# Patient Record
Sex: Male | Born: 1995 | Race: Black or African American | Hispanic: No | Marital: Single | Smoking: Current every day smoker
Health system: Southern US, Community
[De-identification: ages and names within clinical notes are randomized; demographics above are authoritative.]

## PROBLEM LIST (undated history)

## (undated) DIAGNOSIS — L0291 Cutaneous abscess, unspecified: Secondary | ICD-10-CM

---
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
## 2016-07-27 ENCOUNTER — Emergency Department (HOSPITAL_COMMUNITY)
Admission: EM | Admit: 2016-07-27 | Discharge: 2016-07-27 | Disposition: A | Discharge status: Home / Self Care | Payer: Self-pay | Attending: Emergency Medicine | Admitting: Emergency Medicine

## 2016-07-27 ENCOUNTER — Encounter (HOSPITAL_COMMUNITY): Payer: Self-pay | Admitting: Emergency Medicine

## 2016-07-27 DIAGNOSIS — L02416 Cutaneous abscess of left lower limb: Secondary | ICD-10-CM | POA: Insufficient documentation

## 2016-07-27 DIAGNOSIS — Y929 Unspecified place or not applicable: Secondary | ICD-10-CM | POA: Insufficient documentation

## 2016-07-27 DIAGNOSIS — Y939 Activity, unspecified: Secondary | ICD-10-CM | POA: Insufficient documentation

## 2016-07-27 DIAGNOSIS — F17200 Nicotine dependence, unspecified, uncomplicated: Secondary | ICD-10-CM | POA: Insufficient documentation

## 2016-07-27 DIAGNOSIS — Y999 Unspecified external cause status: Secondary | ICD-10-CM | POA: Insufficient documentation

## 2016-07-27 DIAGNOSIS — W57XXXA Bitten or stung by nonvenomous insect and other nonvenomous arthropods, initial encounter: Secondary | ICD-10-CM | POA: Insufficient documentation

## 2016-07-27 MED ORDER — SULFAMETHOXAZOLE-TRIMETHOPRIM 800-160 MG PO TABS: 1.0000 | ORAL_TABLET | Freq: Two times a day (BID) | ORAL | 0 refills | Status: AC

## 2016-07-27 MED ORDER — NAPROXEN 500 MG PO TABS: 500.0000 mg | ORAL_TABLET | Freq: Two times a day (BID) | ORAL | 0 refills | Status: DC

## 2016-07-27 NOTE — ED Notes (Signed)
See np assessment 

## 2016-07-27 NOTE — ED Triage Notes (Signed)
Pt presents to ED for assessment of a bite ntoed to his left knee, with swelling, warmth, pain and swollen lymphnodes in the groin.  Pt has 99.7 temperature.  Pt sts he noted the "bite" 2 days ago.

## 2016-07-27 NOTE — ED Provider Notes (Signed)
[facility]EMERGENCY DEPT Provider Note   CSN: [id number] Arrival date & time: 07/27/16  1804  By signing my name below, I, Christel Mormon, attest that this documentation has been prepared under the direction and in the presence of Kerrie Buffalo, NP. Electronically Signed: Christel Mormon, Scribe. 07/27/2016. 7:04 PM.    History   Chief Complaint Chief Complaint  Patient presents with  . Abscess    The history is provided by the patient. No language interpreter was used.   HPI Comments:  [name] is a 20 y.o. male who presents to the Emergency Department complaining of a bite to his L knee that occurred 2 days ago. Pt reports swollen lymph nodes in the groin. Pt reports that he may have been bitten by something. Pt denies fever, chills, nausea, vomiting.   History reviewed. No pertinent past medical history.  There are no active problems to display for this patient.   History reviewed. No pertinent surgical history.     Home Medications    Prior to Admission medications   Medication Sig Start Date End Date Taking? Authorizing Provider  naproxen (NAPROSYN) 500 MG tablet Take 1 tablet (500 mg total) by mouth 2 (two) times daily. 07/27/16   Katrianna Friesenhahn Orlene Och, NP  sulfamethoxazole-trimethoprim (BACTRIM DS,SEPTRA DS) 800-160 MG tablet Take 1 tablet by mouth 2 (two) times daily. 07/27/16 08/03/16  Nidhi Jacome Orlene Och, NP    Family History History reviewed. No pertinent family history.  Social History Social History  Substance Use Topics  . Smoking status: Current Every Day Smoker    Packs/day: 0.50  . Smokeless tobacco: Never Used  . Alcohol use No     Allergies   Review of patient's allergies indicates not on file.   Review of Systems Review of Systems  Constitutional: Negative for chills.  Skin: Positive for wound.  Hematological: Positive for adenopathy.  All other systems reviewed and are negative.    Physical Exam Updated Vital Signs BP 130/74 (BP Location: Left  Arm)   Pulse 71   Temp 99.3 F (37.4 C) (Oral)   Resp 19   Ht 6\' 1"  (1.854 m)   Wt 67.3 kg   SpO2 97%   BMI 19.58 kg/m   Physical Exam  Constitutional: He is oriented to person, place, and time. He appears well-developed and well-nourished. No distress.  HENT:  Head: Normocephalic and atraumatic.  Eyes: Conjunctivae are normal.  Cardiovascular: Normal rate and intact distal pulses.   Pulmonary/Chest: Effort normal.  Abdominal: He exhibits no distension.  Musculoskeletal: Normal range of motion. He exhibits no edema.  Neurological: He is alert and oriented to person, place, and time.  Skin: Skin is warm and dry. No erythema.  Pea-sized lymph nodes palpable in L inguinal area.   Area on medial aspect of L knee that is scabbed and tender on palpation. Pain radiates to inner aspect of L thigh. No cellulitis or red streaking.  Psychiatric: He has a normal mood and affect.  Nursing note and vitals reviewed.    ED Treatments / Results  DIAGNOSTIC STUDIES:  Oxygen Saturation is 96% on RA, normal by my interpretation.    COORDINATION OF CARE:  7:04 PM Discussed treatment plan with pt at bedside and pt agreed to plan.   Labs (all labs ordered are listed, but only abnormal results are displayed) Labs Reviewed - No data to display  Radiology No results found.  Procedures Procedures (including critical care time)  Medications Ordered in ED Medications - No data  to display   Initial Impression / Assessment and Plan / ED Course  I have reviewed the triage vital signs and the nursing notes.  Clinical Course    Final Clinical Impressions(s) / ED Diagnoses  20 y.o. male with insect bite to the left knee and left inguinal lymph node enlargement stable for d/c without fever and does not appear toxic. Discussed with the patient clinical findings and plan of care. All questioned fully answered. He will return if any problems arise.  Final diagnoses:  Infected insect bite     New Prescriptions Discharge Medication List as of 07/27/2016  7:08 PM    START taking these medications   Details  naproxen (NAPROSYN) 500 MG tablet Take 1 tablet (500 mg total) by mouth 2 (two) times daily., Starting Wed 07/27/2016, Print    sulfamethoxazole-trimethoprim (BACTRIM DS,SEPTRA DS) 800-160 MG tablet Take 1 tablet by mouth 2 (two) times daily., Starting Wed 07/27/2016, Until Wed 08/03/2016, Print      I personally performed the services described in this documentation, which was scribed in my presence. The recorded information has been reviewed and is accurate.     [address]Kayona Foor CarmineM Michalla Ringer, NP 07/29/16 0127    Maia PlanJoshua G Long, MD 07/29/16 1009

## 2016-07-27 NOTE — Discharge Instructions (Signed)
Return for worsening symptoms such as fever, red streaking or n/v.  Apply warm wet compresses to the area 3 times a day.

## 2016-09-28 ENCOUNTER — Encounter (HOSPITAL_COMMUNITY): Payer: Self-pay | Admitting: Emergency Medicine

## 2016-09-28 ENCOUNTER — Ambulatory Visit (HOSPITAL_COMMUNITY)
Admission: EM | Admit: 2016-09-28 | Discharge: 2016-09-28 | Disposition: A | Discharge status: Home / Self Care | Payer: Self-pay | Attending: Family Medicine | Admitting: Family Medicine

## 2016-09-28 DIAGNOSIS — Z202 Contact with and (suspected) exposure to infections with a predominantly sexual mode of transmission: Secondary | ICD-10-CM | POA: Insufficient documentation

## 2016-09-28 DIAGNOSIS — F17210 Nicotine dependence, cigarettes, uncomplicated: Secondary | ICD-10-CM | POA: Insufficient documentation

## 2016-09-28 MED ORDER — METRONIDAZOLE 500 MG PO TABS: 2000.0000 mg | ORAL_TABLET | Freq: Once | ORAL | 0 refills | Status: AC

## 2016-09-28 NOTE — ED Provider Notes (Signed)
CSN: [id number]     Arrival date & time 09/28/16  1400 History   First MD Initiated Contact with Patient 09/28/16 1533     Chief Complaint  Patient presents with  . Exposure to STD   (Consider location/radiation/quality/duration/timing/severity/associated sxs/prior Treatment) Patient is a healthy 20 y.o male is here for STD testing. Girlfriend is present in room. Patient was brought in by the girlfriend. Girlfriend got tested positive for Tr[facility] 2 days ago [facility] but negative for GC/CT. Girlfriend reports that patient is her only sexual partner. Patient himself is asymptomatic without any penile rash, discharge, pain, dysuria, testicular pain or testicular swelling. Girlfriend asked to stepped out the room for privacy and patient reports to have only 1 sexual partner as well. Patient would like to be treated for trich presumptively.       History reviewed. No pertinent past medical history. History reviewed. No pertinent surgical history. No family history on file. Social History  Substance Use Topics  . Smoking status: Current Every Day Smoker    Packs/day: 0.50  . Smokeless tobacco: Never Used  . Alcohol use No    Review of Systems  Constitutional:       As stated in the HPI    Allergies  Patient has no known allergies.  Home Medications   Prior to Admission medications   Medication Sig Start Date End Date Taking? Authorizing Provider  metroNIDAZOLE (FLAGYL) 500 MG tablet Take 4 tablets (2,000 mg total) by mouth once. 09/28/16 09/28/16  Lucia EstelleFeng Sie Formisano, NP  naproxen (NAPROSYN) 500 MG tablet Take 1 tablet (500 mg total) by mouth 2 (two) times daily. 07/27/16   Hope Orlene OchM Neese, NP   Meds Ordered and Administered this Visit  Medications - No data to display  BP 143/55 (BP Location: Left Arm)   Pulse (!) 58   Temp 98.2 F (36.8 C) (Oral)   Resp 16   SpO2 100%  No data found.   Physical Exam  Constitutional: He is oriented to person, place, and time. He  appears well-developed and well-nourished.  Cardiovascular: Normal rate, regular rhythm and normal heart sounds.   Pulmonary/Chest: Effort normal and breath sounds normal. No respiratory distress.  Genitourinary:  Genitourinary Comments: Declines examination  Neurological: He is alert and oriented to person, place, and time.  Nursing note and vitals reviewed.   Urgent Care Course   Clinical Course     Procedures (including critical care time)  Labs Review Labs Reviewed  URINE CYTOLOGY ANCILLARY ONLY    Imaging Review No results found.  MDM   1. STD exposure    1) Cytology pending for GC/CT and Trich 2) Rx for flagyl 2000 mg once given 3) Informed that we only call patient if result is positive. 4) Avoid sexual intercourse until treated.  5) Notify any other sexual partner (if any) if result(s) are positive.    Lucia EstelleFeng Calysta Craigo, NP 09/28/16 [phone]

## 2016-09-28 NOTE — Discharge Instructions (Signed)
Call back in 3-5 days to find out about the result.

## 2016-09-29 LAB — URINE CYTOLOGY ANCILLARY ONLY
Chlamydia: NEGATIVE
NEISSERIA GONORRHEA: NEGATIVE
Trichomonas: NEGATIVE

## 2020-06-17 ENCOUNTER — Encounter (HOSPITAL_COMMUNITY): Payer: Self-pay | Admitting: Emergency Medicine

## 2020-06-17 ENCOUNTER — Other Ambulatory Visit: Payer: Self-pay

## 2020-06-17 ENCOUNTER — Emergency Department (HOSPITAL_COMMUNITY)
Admission: EM | Admit: 2020-06-17 | Discharge: 2020-06-17 | Disposition: A | Discharge status: Home / Self Care | Payer: Self-pay | Attending: Emergency Medicine | Admitting: Emergency Medicine

## 2020-06-17 DIAGNOSIS — Y998 Other external cause status: Secondary | ICD-10-CM | POA: Insufficient documentation

## 2020-06-17 DIAGNOSIS — Y929 Unspecified place or not applicable: Secondary | ICD-10-CM | POA: Insufficient documentation

## 2020-06-17 DIAGNOSIS — S1190XA Unspecified open wound of unspecified part of neck, initial encounter: Secondary | ICD-10-CM | POA: Insufficient documentation

## 2020-06-17 DIAGNOSIS — W3400XA Accidental discharge from unspecified firearms or gun, initial encounter: Secondary | ICD-10-CM | POA: Insufficient documentation

## 2020-06-17 DIAGNOSIS — Z5321 Procedure and treatment not carried out due to patient leaving prior to being seen by health care provider: Secondary | ICD-10-CM | POA: Insufficient documentation

## 2020-06-17 DIAGNOSIS — Y939 Activity, unspecified: Secondary | ICD-10-CM | POA: Insufficient documentation

## 2020-06-17 NOTE — ED Notes (Signed)
Patient called for room placement x3 without answer. 
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
## 2020-07-03 ENCOUNTER — Emergency Department (HOSPITAL_COMMUNITY)
Admission: EM | Admit: 2020-07-03 | Discharge: 2020-07-03 | Discharge status: Against Medical Advice | Payer: Medicaid Other

## 2020-07-03 NOTE — ED Notes (Signed)
Pt left prior to triage

## 2020-11-13 ENCOUNTER — Encounter (HOSPITAL_COMMUNITY): Payer: Self-pay

## 2020-11-13 ENCOUNTER — Emergency Department (HOSPITAL_COMMUNITY)
Admission: EM | Admit: 2020-11-13 | Discharge: 2020-11-13 | Disposition: A | Discharge status: Home / Self Care | Payer: Medicaid Other | Attending: Emergency Medicine | Admitting: Emergency Medicine

## 2020-11-13 ENCOUNTER — Other Ambulatory Visit: Payer: Self-pay

## 2020-11-13 DIAGNOSIS — R4182 Altered mental status, unspecified: Secondary | ICD-10-CM | POA: Insufficient documentation

## 2020-11-13 DIAGNOSIS — Y907 Blood alcohol level of 200-239 mg/100 ml: Secondary | ICD-10-CM | POA: Insufficient documentation

## 2020-11-13 DIAGNOSIS — Z5321 Procedure and treatment not carried out due to patient leaving prior to being seen by health care provider: Secondary | ICD-10-CM | POA: Insufficient documentation

## 2020-11-13 DIAGNOSIS — R42 Dizziness and giddiness: Secondary | ICD-10-CM | POA: Insufficient documentation

## 2020-11-13 DIAGNOSIS — F10129 Alcohol abuse with intoxication, unspecified: Secondary | ICD-10-CM | POA: Insufficient documentation

## 2020-11-13 LAB — COMPREHENSIVE METABOLIC PANEL
ALT: 22 U/L (ref 0–44)
AST: 25 U/L (ref 15–41)
Albumin: 3.9 g/dL (ref 3.5–5.0)
Alkaline Phosphatase: 53 U/L (ref 38–126)
Anion gap: 12 (ref 5–15)
BUN: 9 mg/dL (ref 6–20)
CO2: 25 mmol/L (ref 22–32)
Calcium: 9.3 mg/dL (ref 8.9–10.3)
Chloride: 105 mmol/L (ref 98–111)
Creatinine, Ser: 1.11 mg/dL (ref 0.61–1.24)
GFR, Estimated: >60 mL/min (ref >60)
Glucose, Bld: 93 mg/dL (ref 70–99)
Potassium: 3.8 mmol/L (ref 3.5–5.1)
Sodium: 142 mmol/L (ref 135–145)
Total Bilirubin: 0.7 mg/dL (ref 0.3–1.2)
Total Protein: 7 g/dL (ref 6.5–8.1)

## 2020-11-13 LAB — SALICYLATE LEVEL: Salicylate Lvl: <7 mg/dL — ABNORMAL LOW (ref 7.0–30.0)

## 2020-11-13 LAB — RAPID URINE DRUG SCREEN, HOSP PERFORMED
Amphetamines: NOT DETECTED
Barbiturates: NOT DETECTED
Benzodiazepines: NOT DETECTED
Cocaine: NOT DETECTED
Opiates: NOT DETECTED
Tetrahydrocannabinol: NOT DETECTED

## 2020-11-13 LAB — ACETAMINOPHEN LEVEL: Acetaminophen (Tylenol), Serum: <10 ug/mL — ABNORMAL LOW (ref 10–30)

## 2020-11-13 LAB — CBC
HCT: 50.7 % (ref 39.0–52.0)
Hemoglobin: 16 g/dL (ref 13.0–17.0)
MCH: 28.3 pg (ref 26.0–34.0)
MCHC: 31.6 g/dL (ref 30.0–36.0)
MCV: 89.6 fL (ref 80.0–100.0)
Platelets: 245 10*3/uL (ref 150–400)
RBC: 5.66 MIL/uL (ref 4.22–5.81)
RDW: 13.1 % (ref 11.5–15.5)
WBC: 4.6 10*3/uL (ref 4.0–10.5)
nRBC: 0 % (ref 0.0–0.2)

## 2020-11-13 LAB — ETHANOL: Alcohol, Ethyl (B): 209 mg/dL — ABNORMAL HIGH (ref <10)
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
## 2020-11-27 ENCOUNTER — Emergency Department (HOSPITAL_COMMUNITY): Payer: Medicaid Other

## 2020-11-27 ENCOUNTER — Emergency Department (HOSPITAL_COMMUNITY)
Admission: EM | Admit: 2020-11-27 | Discharge: 2020-11-27 | Disposition: A | Discharge status: Home / Self Care | Payer: Medicaid Other | Attending: Emergency Medicine | Admitting: Emergency Medicine

## 2020-11-27 ENCOUNTER — Encounter (HOSPITAL_COMMUNITY): Payer: Self-pay

## 2020-11-27 DIAGNOSIS — T1490XA Injury, unspecified, initial encounter: Secondary | ICD-10-CM

## 2020-11-27 DIAGNOSIS — Y92524 Gas station as the place of occurrence of the external cause: Secondary | ICD-10-CM | POA: Insufficient documentation

## 2020-11-27 DIAGNOSIS — W3400XA Accidental discharge from unspecified firearms or gun, initial encounter: Secondary | ICD-10-CM

## 2020-11-27 DIAGNOSIS — Y249XXA Unspecified firearm discharge, undetermined intent, initial encounter: Secondary | ICD-10-CM | POA: Insufficient documentation

## 2020-11-27 DIAGNOSIS — Z20822 Contact with and (suspected) exposure to covid-19: Secondary | ICD-10-CM | POA: Insufficient documentation

## 2020-11-27 DIAGNOSIS — S71101A Unspecified open wound, right thigh, initial encounter: Secondary | ICD-10-CM | POA: Insufficient documentation

## 2020-11-27 DIAGNOSIS — Z23 Encounter for immunization: Secondary | ICD-10-CM | POA: Insufficient documentation

## 2020-11-27 LAB — PROTIME-INR
INR: 0.9 (ref 0.8–1.2)
Prothrombin Time: 12.2 seconds (ref 11.4–15.2)

## 2020-11-27 LAB — I-STAT CHEM 8, ED
BUN: 14 mg/dL (ref 6–20)
Calcium, Ion: 1.07 mmol/L — ABNORMAL LOW (ref 1.15–1.40)
Chloride: 105 mmol/L (ref 98–111)
Creatinine, Ser: 1.3 mg/dL — ABNORMAL HIGH (ref 0.61–1.24)
Glucose, Bld: 94 mg/dL (ref 70–99)
HCT: 50 % (ref 39.0–52.0)
Hemoglobin: 17 g/dL (ref 13.0–17.0)
Potassium: 3.4 mmol/L — ABNORMAL LOW (ref 3.5–5.1)
Sodium: 141 mmol/L (ref 135–145)
TCO2: 23 mmol/L (ref 22–32)

## 2020-11-27 LAB — ETHANOL: Alcohol, Ethyl (B): 183 mg/dL — ABNORMAL HIGH (ref <10)

## 2020-11-27 LAB — COMPREHENSIVE METABOLIC PANEL
ALT: 22 U/L (ref 0–44)
AST: 26 U/L (ref 15–41)
Albumin: 4 g/dL (ref 3.5–5.0)
Alkaline Phosphatase: 61 U/L (ref 38–126)
Anion gap: 14 (ref 5–15)
BUN: 13 mg/dL (ref 6–20)
CO2: 20 mmol/L — ABNORMAL LOW (ref 22–32)
Calcium: 9.1 mg/dL (ref 8.9–10.3)
Chloride: 105 mmol/L (ref 98–111)
Creatinine, Ser: 1.17 mg/dL (ref 0.61–1.24)
GFR, Estimated: >60 mL/min (ref >60)
Glucose, Bld: 102 mg/dL — ABNORMAL HIGH (ref 70–99)
Potassium: 3.4 mmol/L — ABNORMAL LOW (ref 3.5–5.1)
Sodium: 139 mmol/L (ref 135–145)
Total Bilirubin: 0.7 mg/dL (ref 0.3–1.2)
Total Protein: 7.1 g/dL (ref 6.5–8.1)

## 2020-11-27 LAB — CBC
HCT: 50.7 % (ref 39.0–52.0)
Hemoglobin: 16 g/dL (ref 13.0–17.0)
MCH: 28.6 pg (ref 26.0–34.0)
MCHC: 31.6 g/dL (ref 30.0–36.0)
MCV: 90.5 fL (ref 80.0–100.0)
Platelets: 206 10*3/uL (ref 150–400)
RBC: 5.6 MIL/uL (ref 4.22–5.81)
RDW: 13.3 % (ref 11.5–15.5)
WBC: 7.8 10*3/uL (ref 4.0–10.5)
nRBC: 0 % (ref 0.0–0.2)

## 2020-11-27 LAB — SARS CORONAVIRUS 2 BY RT PCR (HOSPITAL ORDER, PERFORMED IN ~~LOC~~ HOSPITAL LAB): SARS Coronavirus 2: NEGATIVE

## 2020-11-27 LAB — SAMPLE TO BLOOD BANK

## 2020-11-27 LAB — LACTIC ACID, PLASMA: Lactic Acid, Venous: 2.8 mmol/L (ref 0.5–1.9)

## 2020-11-27 MED ORDER — TETANUS-DIPHTH-ACELL PERTUSSIS 5-2.5-18.5 LF-MCG/0.5 IM SUSY
0.5000 mL | PREFILLED_SYRINGE | Freq: Once | INTRAMUSCULAR | Status: AC
  Administered 2020-11-27: 0.5 mL via INTRAMUSCULAR

## 2020-11-27 MED ORDER — IOHEXOL 350 MG/ML SOLN
100.0000 mL | Freq: Once | INTRAVENOUS | Status: AC | PRN
  Administered 2020-11-27: 100 mL via INTRAVENOUS

## 2020-11-27 MED ORDER — FENTANYL CITRATE (PF) 100 MCG/2ML IJ SOLN
50.0000 ug | Freq: Once | INTRAMUSCULAR | Status: AC
  Administered 2020-11-27: 50 ug via INTRAVENOUS

## 2020-11-27 MED ORDER — HYDROCODONE-ACETAMINOPHEN 5-325 MG PO TABS: 2.0000 | ORAL_TABLET | Freq: Four times a day (QID) | ORAL | 0 refills | Status: DC | PRN

## 2020-11-27 MED ORDER — FENTANYL CITRATE (PF) 100 MCG/2ML IJ SOLN
INTRAMUSCULAR | Status: AC
  Filled 2020-11-27: qty 2

## 2020-11-27 NOTE — ED Notes (Signed)
Mother at bedside.
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
## 2020-11-30 ENCOUNTER — Encounter (HOSPITAL_COMMUNITY): Payer: Self-pay

## 2020-12-08 ENCOUNTER — Encounter (HOSPITAL_COMMUNITY): Payer: Self-pay

## 2020-12-08 ENCOUNTER — Inpatient Hospital Stay (HOSPITAL_COMMUNITY)
Admission: EM | Admit: 2020-12-08 | Discharge: 2020-12-11 | DRG: 581 | Disposition: A | Discharge status: Home / Self Care | Payer: Self-pay | Attending: Orthopedic Surgery | Admitting: Orthopedic Surgery

## 2020-12-08 ENCOUNTER — Other Ambulatory Visit: Payer: Self-pay

## 2020-12-08 ENCOUNTER — Emergency Department (HOSPITAL_COMMUNITY): Payer: Medicaid Other

## 2020-12-08 DIAGNOSIS — L02419 Cutaneous abscess of limb, unspecified: Secondary | ICD-10-CM | POA: Diagnosis present

## 2020-12-08 DIAGNOSIS — L02415 Cutaneous abscess of right lower limb: Principal | ICD-10-CM | POA: Diagnosis present

## 2020-12-08 DIAGNOSIS — S71131A Puncture wound without foreign body, right thigh, initial encounter: Secondary | ICD-10-CM

## 2020-12-08 DIAGNOSIS — S71141D Puncture wound with foreign body, right thigh, subsequent encounter: Secondary | ICD-10-CM

## 2020-12-08 DIAGNOSIS — R509 Fever, unspecified: Secondary | ICD-10-CM

## 2020-12-08 DIAGNOSIS — Z20822 Contact with and (suspected) exposure to covid-19: Secondary | ICD-10-CM | POA: Diagnosis present

## 2020-12-08 DIAGNOSIS — F17210 Nicotine dependence, cigarettes, uncomplicated: Secondary | ICD-10-CM | POA: Diagnosis present

## 2020-12-08 DIAGNOSIS — A419 Sepsis, unspecified organism: Secondary | ICD-10-CM

## 2020-12-08 DIAGNOSIS — L0291 Cutaneous abscess, unspecified: Secondary | ICD-10-CM

## 2020-12-08 DIAGNOSIS — W3409XD Accidental discharge from other specified firearms, subsequent encounter: Secondary | ICD-10-CM

## 2020-12-08 DIAGNOSIS — L03115 Cellulitis of right lower limb: Secondary | ICD-10-CM | POA: Diagnosis present

## 2020-12-08 HISTORY — DX: Cutaneous abscess, unspecified: L02.91

## 2020-12-08 MED ORDER — VANCOMYCIN HCL 1500 MG/300ML IV SOLN
1500.0000 mg | Freq: Once | INTRAVENOUS | Status: AC
  Administered 2020-12-09: 1500 mg via INTRAVENOUS
  Filled 2020-12-08: qty 300

## 2020-12-08 MED ORDER — LACTATED RINGERS IV SOLN: INTRAVENOUS | Status: AC

## 2020-12-08 MED ORDER — SODIUM CHLORIDE 0.9 % IV SOLN
2.0000 g | Freq: Once | INTRAVENOUS | Status: AC
  Administered 2020-12-08: 2 g via INTRAVENOUS
  Filled 2020-12-08: qty 20

## 2020-12-08 MED ORDER — LACTATED RINGERS IV BOLUS (SEPSIS)
1000.0000 mL | Freq: Once | INTRAVENOUS | Status: AC
  Administered 2020-12-09: 1000 mL via INTRAVENOUS

## 2020-12-08 MED ORDER — MORPHINE SULFATE (PF) 4 MG/ML IV SOLN
4.0000 mg | Freq: Once | INTRAVENOUS | Status: DC
Start: 2020-12-08 — End: 2020-12-12
  Filled 2020-12-08: qty 1

## 2020-12-08 MED ORDER — ACETAMINOPHEN 325 MG PO TABS
650.0000 mg | ORAL_TABLET | Freq: Once | ORAL | Status: AC
  Administered 2020-12-09: 650 mg via ORAL
  Filled 2020-12-08: qty 2

## 2020-12-08 MED ORDER — LACTATED RINGERS IV BOLUS (SEPSIS)
1000.0000 mL | Freq: Once | INTRAVENOUS | Status: AC
Start: 2020-12-08 — End: 2020-12-09
  Administered 2020-12-09: 1000 mL via INTRAVENOUS
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
## 2020-12-09 ENCOUNTER — Encounter (HOSPITAL_COMMUNITY): Payer: Self-pay | Admitting: Internal Medicine

## 2020-12-09 ENCOUNTER — Inpatient Hospital Stay (HOSPITAL_COMMUNITY): Payer: Medicaid Other | Admitting: Certified Registered Nurse Anesthetist

## 2020-12-09 ENCOUNTER — Encounter (HOSPITAL_COMMUNITY)
Admission: EM | Disposition: A | Discharge status: Home / Self Care | Payer: Self-pay | Source: Home / Self Care | Attending: Orthopedic Surgery

## 2020-12-09 ENCOUNTER — Emergency Department (HOSPITAL_COMMUNITY): Payer: Medicaid Other

## 2020-12-09 DIAGNOSIS — A419 Sepsis, unspecified organism: Secondary | ICD-10-CM

## 2020-12-09 DIAGNOSIS — L02419 Cutaneous abscess of limb, unspecified: Secondary | ICD-10-CM | POA: Diagnosis present

## 2020-12-09 DIAGNOSIS — L02415 Cutaneous abscess of right lower limb: Secondary | ICD-10-CM | POA: Diagnosis present

## 2020-12-09 DIAGNOSIS — S71131A Puncture wound without foreign body, right thigh, initial encounter: Secondary | ICD-10-CM

## 2020-12-09 DIAGNOSIS — L03115 Cellulitis of right lower limb: Secondary | ICD-10-CM

## 2020-12-09 DIAGNOSIS — R509 Fever, unspecified: Secondary | ICD-10-CM

## 2020-12-09 HISTORY — PX: OTHER SURGICAL HISTORY: SHX169

## 2020-12-09 HISTORY — PX: I & D EXTREMITY: SHX5045

## 2020-12-09 LAB — CBC WITH DIFFERENTIAL/PLATELET
Abs Immature Granulocytes: 0.1 10*3/uL — ABNORMAL HIGH (ref 0.00–0.07)
Basophils Absolute: 0 10*3/uL (ref 0.0–0.1)
Basophils Relative: 0 %
Eosinophils Absolute: 0.1 10*3/uL (ref 0.0–0.5)
Eosinophils Relative: 0 %
HCT: 43.7 % (ref 39.0–52.0)
Hemoglobin: 14 g/dL (ref 13.0–17.0)
Immature Granulocytes: 1 %
Lymphocytes Relative: 15 %
Lymphs Abs: 2.4 10*3/uL (ref 0.7–4.0)
MCH: 28.3 pg (ref 26.0–34.0)
MCHC: 32 g/dL (ref 30.0–36.0)
MCV: 88.3 fL (ref 80.0–100.0)
Monocytes Absolute: 0.6 10*3/uL (ref 0.1–1.0)
Monocytes Relative: 4 %
Neutro Abs: 12.2 10*3/uL — ABNORMAL HIGH (ref 1.7–7.7)
Neutrophils Relative %: 80 %
Platelets: 450 10*3/uL — ABNORMAL HIGH (ref 150–400)
RBC: 4.95 MIL/uL (ref 4.22–5.81)
RDW: 12.8 % (ref 11.5–15.5)
WBC: 15.4 10*3/uL — ABNORMAL HIGH (ref 4.0–10.5)
nRBC: 0 % (ref 0.0–0.2)

## 2020-12-09 LAB — PROTIME-INR
INR: 1.1 (ref 0.8–1.2)
Prothrombin Time: 13.8 seconds (ref 11.4–15.2)

## 2020-12-09 LAB — BASIC METABOLIC PANEL
Anion gap: 11 (ref 5–15)
BUN: 10 mg/dL (ref 6–20)
CO2: 23 mmol/L (ref 22–32)
Calcium: 8.3 mg/dL — ABNORMAL LOW (ref 8.9–10.3)
Chloride: 103 mmol/L (ref 98–111)
Creatinine, Ser: 0.88 mg/dL (ref 0.61–1.24)
GFR, Estimated: >60 mL/min (ref >60)
Glucose, Bld: 92 mg/dL (ref 70–99)
Potassium: 3.9 mmol/L (ref 3.5–5.1)
Sodium: 137 mmol/L (ref 135–145)

## 2020-12-09 LAB — URINALYSIS, ROUTINE W REFLEX MICROSCOPIC
Bilirubin Urine: NEGATIVE
Glucose, UA: NEGATIVE mg/dL
Hgb urine dipstick: NEGATIVE
Ketones, ur: NEGATIVE mg/dL
Leukocytes,Ua: NEGATIVE
Nitrite: NEGATIVE
Protein, ur: NEGATIVE mg/dL
Specific Gravity, Urine: 1.034 — ABNORMAL HIGH (ref 1.005–1.030)
pH: 6 (ref 5.0–8.0)

## 2020-12-09 LAB — CBC
HCT: 39.3 % (ref 39.0–52.0)
Hemoglobin: 13 g/dL (ref 13.0–17.0)
MCH: 29.1 pg (ref 26.0–34.0)
MCHC: 33.1 g/dL (ref 30.0–36.0)
MCV: 88.1 fL (ref 80.0–100.0)
Platelets: 389 10*3/uL (ref 150–400)
RBC: 4.46 MIL/uL (ref 4.22–5.81)
RDW: 12.9 % (ref 11.5–15.5)
WBC: 11 10*3/uL — ABNORMAL HIGH (ref 4.0–10.5)
nRBC: 0 % (ref 0.0–0.2)

## 2020-12-09 LAB — COMPREHENSIVE METABOLIC PANEL
ALT: 39 U/L (ref 0–44)
AST: 27 U/L (ref 15–41)
Albumin: 2.8 g/dL — ABNORMAL LOW (ref 3.5–5.0)
Alkaline Phosphatase: 59 U/L (ref 38–126)
Anion gap: 11 (ref 5–15)
BUN: 10 mg/dL (ref 6–20)
CO2: 25 mmol/L (ref 22–32)
Calcium: 8.9 mg/dL (ref 8.9–10.3)
Chloride: 100 mmol/L (ref 98–111)
Creatinine, Ser: 1.07 mg/dL (ref 0.61–1.24)
GFR, Estimated: >60 mL/min (ref >60)
Glucose, Bld: 100 mg/dL — ABNORMAL HIGH (ref 70–99)
Potassium: 3.7 mmol/L (ref 3.5–5.1)
Sodium: 136 mmol/L (ref 135–145)
Total Bilirubin: 0.7 mg/dL (ref 0.3–1.2)
Total Protein: 7.2 g/dL (ref 6.5–8.1)

## 2020-12-09 LAB — APTT: aPTT: 28 seconds (ref 24–36)

## 2020-12-09 LAB — RESP PANEL BY RT-PCR (FLU A&B, COVID) ARPGX2
Influenza A by PCR: NEGATIVE
Influenza B by PCR: NEGATIVE
SARS Coronavirus 2 by RT PCR: NEGATIVE

## 2020-12-09 LAB — LACTIC ACID, PLASMA
Lactic Acid, Venous: 1 mmol/L (ref 0.5–1.9)
Lactic Acid, Venous: 1.4 mmol/L (ref 0.5–1.9)

## 2020-12-09 LAB — HIV ANTIBODY (ROUTINE TESTING W REFLEX): HIV Screen 4th Generation wRfx: NONREACTIVE

## 2020-12-09 SURGERY — IRRIGATION AND DEBRIDEMENT EXTREMITY
Anesthesia: General | Site: Leg Upper | Laterality: Right

## 2020-12-09 MED ORDER — SODIUM CHLORIDE 0.9 % IV SOLN: INTRAVENOUS | Status: DC

## 2020-12-09 MED ORDER — PROPOFOL 10 MG/ML IV BOLUS
INTRAVENOUS | Status: DC | PRN
Start: 2020-12-09 — End: 2020-12-09
  Administered 2020-12-09: 200 mg via INTRAVENOUS

## 2020-12-09 MED ORDER — VANCOMYCIN HCL 1500 MG/300ML IV SOLN
1500.0000 mg | Freq: Two times a day (BID) | INTRAVENOUS | Status: DC
  Administered 2020-12-09 – 2020-12-11 (×5): 1500 mg via INTRAVENOUS
  Filled 2020-12-09 (×7): qty 300

## 2020-12-09 MED ORDER — SODIUM CHLORIDE 0.9 % IV SOLN
2.0000 g | INTRAVENOUS | Status: DC
  Administered 2020-12-09 – 2020-12-11 (×3): 2 g via INTRAVENOUS
  Filled 2020-12-09 (×2): qty 20

## 2020-12-09 MED ORDER — METOCLOPRAMIDE HCL 5 MG/ML IJ SOLN: 5.0000 mg | Freq: Three times a day (TID) | INTRAMUSCULAR | Status: DC | PRN

## 2020-12-09 MED ORDER — ACETAMINOPHEN 650 MG RE SUPP: 650.0000 mg | Freq: Four times a day (QID) | RECTAL | Status: DC | PRN

## 2020-12-09 MED ORDER — PROMETHAZINE HCL 25 MG/ML IJ SOLN: 6.2500 mg | INTRAMUSCULAR | Status: DC | PRN

## 2020-12-09 MED ORDER — OXYCODONE HCL 5 MG/5ML PO SOLN: 5.0000 mg | Freq: Once | ORAL | Status: AC | PRN

## 2020-12-09 MED ORDER — 0.9 % SODIUM CHLORIDE (POUR BTL) OPTIME
TOPICAL | Status: DC | PRN
  Administered 2020-12-09: 1000 mL

## 2020-12-09 MED ORDER — FENTANYL CITRATE (PF) 250 MCG/5ML IJ SOLN
INTRAMUSCULAR | Status: AC
  Filled 2020-12-09: qty 5

## 2020-12-09 MED ORDER — CHLORHEXIDINE GLUCONATE 0.12 % MT SOLN
OROMUCOSAL | Status: AC
  Filled 2020-12-09: qty 15

## 2020-12-09 MED ORDER — ROCURONIUM BROMIDE 10 MG/ML (PF) SYRINGE
PREFILLED_SYRINGE | INTRAVENOUS | Status: AC
  Filled 2020-12-09: qty 10

## 2020-12-09 MED ORDER — CHLORHEXIDINE GLUCONATE 4 % EX LIQD
60.0000 mL | Freq: Once | CUTANEOUS | Status: DC
  Filled 2020-12-09: qty 60

## 2020-12-09 MED ORDER — FENTANYL CITRATE (PF) 100 MCG/2ML IJ SOLN
INTRAMUSCULAR | Status: AC
  Filled 2020-12-09: qty 2

## 2020-12-09 MED ORDER — ONDANSETRON HCL 4 MG/2ML IJ SOLN: 4.0000 mg | Freq: Four times a day (QID) | INTRAMUSCULAR | Status: DC | PRN

## 2020-12-09 MED ORDER — OXYCODONE HCL 5 MG PO TABS
ORAL_TABLET | ORAL | Status: AC
  Filled 2020-12-09: qty 1

## 2020-12-09 MED ORDER — ONDANSETRON HCL 4 MG/2ML IJ SOLN
INTRAMUSCULAR | Status: AC
  Filled 2020-12-09: qty 2

## 2020-12-09 MED ORDER — SODIUM CHLORIDE 0.9 % IR SOLN
Status: DC | PRN
  Administered 2020-12-09: 3000 mL

## 2020-12-09 MED ORDER — DEXAMETHASONE SODIUM PHOSPHATE 10 MG/ML IJ SOLN
INTRAMUSCULAR | Status: AC
  Filled 2020-12-09: qty 1

## 2020-12-09 MED ORDER — SODIUM CHLORIDE 0.9 % IV SOLN
2.0000 g | INTRAVENOUS | Status: DC
  Filled 2020-12-09: qty 20

## 2020-12-09 MED ORDER — HYDROMORPHONE HCL 1 MG/ML IJ SOLN: 0.5000 mg | INTRAMUSCULAR | Status: DC | PRN

## 2020-12-09 MED ORDER — ONDANSETRON HCL 4 MG PO TABS: 4.0000 mg | ORAL_TABLET | Freq: Four times a day (QID) | ORAL | Status: DC | PRN

## 2020-12-09 MED ORDER — ROCURONIUM BROMIDE 10 MG/ML (PF) SYRINGE
PREFILLED_SYRINGE | INTRAVENOUS | Status: DC | PRN
  Administered 2020-12-09: 20 mg via INTRAVENOUS

## 2020-12-09 MED ORDER — PHENYLEPHRINE 40 MCG/ML (10ML) SYRINGE FOR IV PUSH (FOR BLOOD PRESSURE SUPPORT)
PREFILLED_SYRINGE | INTRAVENOUS | Status: DC | PRN
  Administered 2020-12-09 (×4): 80 ug via INTRAVENOUS

## 2020-12-09 MED ORDER — OXYCODONE HCL 5 MG PO TABS: 5.0000 mg | ORAL_TABLET | ORAL | Status: DC | PRN

## 2020-12-09 MED ORDER — ONDANSETRON HCL 4 MG/2ML IJ SOLN
INTRAMUSCULAR | Status: DC | PRN
  Administered 2020-12-09: 4 mg via INTRAVENOUS

## 2020-12-09 MED ORDER — SUCCINYLCHOLINE CHLORIDE 200 MG/10ML IV SOSY
PREFILLED_SYRINGE | INTRAVENOUS | Status: AC
  Filled 2020-12-09: qty 10

## 2020-12-09 MED ORDER — DOCUSATE SODIUM 100 MG PO CAPS
100.0000 mg | ORAL_CAPSULE | Freq: Two times a day (BID) | ORAL | Status: DC
  Administered 2020-12-09 – 2020-12-11 (×4): 100 mg via ORAL
  Filled 2020-12-09 (×4): qty 1

## 2020-12-09 MED ORDER — FENTANYL CITRATE (PF) 250 MCG/5ML IJ SOLN
INTRAMUSCULAR | Status: DC | PRN
  Administered 2020-12-09 (×5): 50 ug via INTRAVENOUS

## 2020-12-09 MED ORDER — LACTATED RINGERS IV SOLN: INTRAVENOUS | Status: DC | PRN

## 2020-12-09 MED ORDER — PHENYLEPHRINE 40 MCG/ML (10ML) SYRINGE FOR IV PUSH (FOR BLOOD PRESSURE SUPPORT)
PREFILLED_SYRINGE | INTRAVENOUS | Status: AC
  Filled 2020-12-09: qty 10

## 2020-12-09 MED ORDER — ACETAMINOPHEN 325 MG PO TABS: 325.0000 mg | ORAL_TABLET | Freq: Four times a day (QID) | ORAL | Status: DC | PRN

## 2020-12-09 MED ORDER — FENTANYL CITRATE (PF) 100 MCG/2ML IJ SOLN
25.0000 ug | INTRAMUSCULAR | Status: DC | PRN
  Administered 2020-12-09 (×3): 50 ug via INTRAVENOUS

## 2020-12-09 MED ORDER — OXYCODONE HCL 5 MG PO TABS
5.0000 mg | ORAL_TABLET | Freq: Once | ORAL | Status: AC | PRN
  Administered 2020-12-09: 5 mg via ORAL

## 2020-12-09 MED ORDER — LIDOCAINE 2% (20 MG/ML) 5 ML SYRINGE
INTRAMUSCULAR | Status: AC
  Filled 2020-12-09: qty 5

## 2020-12-09 MED ORDER — MIDAZOLAM HCL 2 MG/2ML IJ SOLN
INTRAMUSCULAR | Status: DC | PRN
  Administered 2020-12-09: 2 mg via INTRAVENOUS

## 2020-12-09 MED ORDER — KETOROLAC TROMETHAMINE 15 MG/ML IJ SOLN
7.5000 mg | Freq: Four times a day (QID) | INTRAMUSCULAR | Status: AC
  Administered 2020-12-09 – 2020-12-10 (×4): 7.5 mg via INTRAVENOUS
  Filled 2020-12-09 (×4): qty 1

## 2020-12-09 MED ORDER — POVIDONE-IODINE 10 % EX SWAB
2.0000 "application " | Freq: Once | CUTANEOUS | Status: AC
  Administered 2020-12-09: 2 via TOPICAL

## 2020-12-09 MED ORDER — METRONIDAZOLE IN NACL 5-0.79 MG/ML-% IV SOLN
500.0000 mg | Freq: Three times a day (TID) | INTRAVENOUS | Status: DC
  Administered 2020-12-09 – 2020-12-11 (×7): 500 mg via INTRAVENOUS
  Filled 2020-12-09 (×7): qty 100

## 2020-12-09 MED ORDER — ASPIRIN EC 325 MG PO TBEC
325.0000 mg | DELAYED_RELEASE_TABLET | Freq: Every day | ORAL | Status: DC
  Administered 2020-12-09 – 2020-12-11 (×3): 325 mg via ORAL
  Filled 2020-12-09 (×3): qty 1

## 2020-12-09 MED ORDER — MIDAZOLAM HCL 2 MG/2ML IJ SOLN
INTRAMUSCULAR | Status: AC
  Filled 2020-12-09: qty 2

## 2020-12-09 MED ORDER — MORPHINE SULFATE (PF) 2 MG/ML IV SOLN
2.0000 mg | INTRAVENOUS | Status: DC | PRN
  Filled 2020-12-09: qty 1

## 2020-12-09 MED ORDER — SUCCINYLCHOLINE CHLORIDE 200 MG/10ML IV SOSY
PREFILLED_SYRINGE | INTRAVENOUS | Status: DC | PRN
  Administered 2020-12-09: 120 mg via INTRAVENOUS

## 2020-12-09 MED ORDER — PROPOFOL 10 MG/ML IV BOLUS
INTRAVENOUS | Status: AC
  Filled 2020-12-09: qty 20

## 2020-12-09 MED ORDER — LIDOCAINE 2% (20 MG/ML) 5 ML SYRINGE
INTRAMUSCULAR | Status: DC | PRN
  Administered 2020-12-09: 100 mg via INTRAVENOUS

## 2020-12-09 MED ORDER — ACETAMINOPHEN 325 MG PO TABS: 650.0000 mg | ORAL_TABLET | Freq: Four times a day (QID) | ORAL | Status: DC | PRN

## 2020-12-09 MED ORDER — IOHEXOL 300 MG/ML  SOLN
100.0000 mL | Freq: Once | INTRAMUSCULAR | Status: AC | PRN
  Administered 2020-12-09: 100 mL via INTRAVENOUS

## 2020-12-09 MED ORDER — METOCLOPRAMIDE HCL 5 MG PO TABS: 5.0000 mg | ORAL_TABLET | Freq: Three times a day (TID) | ORAL | Status: DC | PRN

## 2020-12-09 SURGICAL SUPPLY — 34 items
BLADE SURG 21 STRL SS (BLADE) ×2 IMPLANT
BNDG COHESIVE 4X5 TAN STRL (GAUZE/BANDAGES/DRESSINGS) ×2 IMPLANT
BNDG COHESIVE 6X5 TAN STRL LF (GAUZE/BANDAGES/DRESSINGS) ×2 IMPLANT
BNDG GAUZE ELAST 4 BULKY (GAUZE/BANDAGES/DRESSINGS) ×2 IMPLANT
CNTNR URN SCR LID CUP LEK RST (MISCELLANEOUS) ×1 IMPLANT
CONT SPEC 4OZ STRL OR WHT (MISCELLANEOUS) ×2
COVER SURGICAL LIGHT HANDLE (MISCELLANEOUS) ×4 IMPLANT
DRAIN PENROSE 0.25X18 (DRAIN) ×2 IMPLANT
DRAPE U-SHAPE 47X51 STRL (DRAPES) ×2 IMPLANT
DRSG ADAPTIC 3X8 NADH LF (GAUZE/BANDAGES/DRESSINGS) ×2 IMPLANT
DRSG PAD ABDOMINAL 8X10 ST (GAUZE/BANDAGES/DRESSINGS) ×4 IMPLANT
DURAPREP 26ML APPLICATOR (WOUND CARE) ×2 IMPLANT
ELECT REM PT RETURN 9FT ADLT (ELECTROSURGICAL) ×2
ELECTRODE REM PT RTRN 9FT ADLT (ELECTROSURGICAL) ×1 IMPLANT
GAUZE SPONGE 4X4 12PLY STRL (GAUZE/BANDAGES/DRESSINGS) ×2 IMPLANT
GLOVE BIOGEL PI IND STRL 9 (GLOVE) ×1 IMPLANT
GLOVE BIOGEL PI INDICATOR 9 (GLOVE) ×1
GLOVE SURG ORTHO 9.0 STRL STRW (GLOVE) ×2 IMPLANT
GOWN STRL REUS W/ TWL XL LVL3 (GOWN DISPOSABLE) ×2 IMPLANT
GOWN STRL REUS W/TWL XL LVL3 (GOWN DISPOSABLE) ×4
HANDPIECE INTERPULSE COAX TIP (DISPOSABLE) ×2
KIT BASIN OR (CUSTOM PROCEDURE TRAY) ×2 IMPLANT
KIT TURNOVER KIT B (KITS) ×2 IMPLANT
MANIFOLD NEPTUNE II (INSTRUMENTS) ×2 IMPLANT
NS IRRIG 1000ML POUR BTL (IV SOLUTION) ×2 IMPLANT
PACK ORTHO EXTREMITY (CUSTOM PROCEDURE TRAY) ×2 IMPLANT
PAD ARMBOARD 7.5X6 YLW CONV (MISCELLANEOUS) ×4 IMPLANT
SET HNDPC FAN SPRY TIP SCT (DISPOSABLE) ×1 IMPLANT
STOCKINETTE IMPERVIOUS 9X36 MD (GAUZE/BANDAGES/DRESSINGS) ×2 IMPLANT
SUT ETHILON 2 0 PSLX (SUTURE) ×2 IMPLANT
SWAB COLLECTION DEVICE MRSA (MISCELLANEOUS) ×2 IMPLANT
TOWEL GREEN STERILE (TOWEL DISPOSABLE) ×2 IMPLANT
TUBE CONNECTING 12X1/4 (SUCTIONS) ×2 IMPLANT
YANKAUER SUCT BULB TIP NO VENT (SUCTIONS) ×2 IMPLANT
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
## 2020-12-09 NOTE — Anesthesia Preprocedure Evaluation (Addendum)
Anesthesia Evaluation  Patient identified by MRN, date of birth, ID band Patient awake    Reviewed: Allergy & Precautions, NPO status , Patient's Chart, lab work & pertinent test results  History of Anesthesia Complications Negative for: history of anesthetic complications  Airway Mallampati: II  TM Distance: >3 FB Neck ROM: Full    Dental no notable dental hx. (+) Teeth Intact   Pulmonary Current Smoker,    Pulmonary exam normal        Cardiovascular negative cardio ROS   Rhythm:Regular Rate:Normal     Neuro/Psych negative neurological ROS  negative psych ROS   GI/Hepatic negative GI ROS, Neg liver ROS,   Endo/Other  negative endocrine ROS  Renal/GU negative Renal ROS  negative genitourinary   Musculoskeletal negative musculoskeletal ROS (+)   Abdominal   Peds  Hematology negative hematology ROS (+)   Anesthesia Other Findings Right thigh abscess  Reproductive/Obstetrics negative OB ROS                           Anesthesia Physical Anesthesia Plan  ASA: II  Anesthesia Plan: General   Post-op Pain Management:    Induction: Intravenous  PONV Risk Score and Plan: 1 and Treatment may vary due to age or medical condition, Ondansetron, Dexamethasone and Midazolam  Airway Management Planned: LMA  Additional Equipment: None  Intra-op Plan:   Post-operative Plan: Extubation in OR  Informed Consent: I have reviewed the patients History and Physical, chart, labs and discussed the procedure including the risks, benefits and alternatives for the proposed anesthesia with the patient or authorized representative who has indicated his/her understanding and acceptance.     Dental advisory given  Plan Discussed with: CRNA  Anesthesia Plan Comments:        Anesthesia Quick Evaluation

## 2020-12-09 NOTE — Progress Notes (Signed)
Patient refused Peridex.
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
## 2020-12-09 NOTE — Progress Notes (Signed)
1510 Received pt from PACU, A&O x4. Right thigh dressing dry and intact. Pain is controlled.
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
## 2020-12-09 NOTE — Transfer of Care (Signed)
Immediate Anesthesia Transfer of Care Note  Patient: [name]  Procedure(s) Performed: IRRIGATION AND DEBRIDEMENT EXTREMITY (Right Leg Upper)  Patient Location: PACU  Anesthesia Type:General  Level of Consciousness: awake and alert   Airway & Oxygen Therapy: Patient Spontanous Breathing  Post-op Assessment: Report given to RN, Post -op Vital signs reviewed and stable and Patient moving all extremities X 4  Post vital signs: Reviewed and stable  Last Vitals:  Vitals Value Taken Time  BP 143/79 12/09/20 1336  Temp    Pulse 88 12/09/20 1340  Resp 17 12/09/20 1338  SpO2 100 % 12/09/20 1340  Vitals shown include unvalidated device data.  Last Pain:  Vitals:   12/09/20 0827  TempSrc: Oral  PainSc: 0-No pain         Complications: No complications documented.

## 2020-12-09 NOTE — Anesthesia Procedure Notes (Signed)
Procedure Name: Intubation Date/Time: 12/09/2020 12:37 PM Performed by: Nils Pyle, CRNA Pre-anesthesia Checklist: Patient identified, Emergency Drugs available, Suction available and Patient being monitored Patient Re-evaluated:Patient Re-evaluated prior to induction Oxygen Delivery Method: Circle System Utilized Preoxygenation: Pre-oxygenation with 100% oxygen Induction Type: IV induction Ventilation: Mask ventilation without difficulty Laryngoscope Size: Miller and 2 Grade View: Grade II Tube type: Oral Tube size: 7.5 mm Number of attempts: 1 Airway Equipment and Method: Stylet and Oral airway Placement Confirmation: ETT inserted through vocal cords under direct vision,  positive ETCO2 and breath sounds checked- equal and bilateral Secured at: 24 cm Tube secured with: Tape Dental Injury: Teeth and Oropharynx as per pre-operative assessment  Comments: Distal glottic opening. Would have benefited from [place] 3.
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
## 2020-12-09 NOTE — Progress Notes (Signed)
CT scan shows intramuscular abscess, would recommend Orthopedic surgery consult. I have contacted Charma Igo PA-C. No other recommendations from a trauma standpoint, we will sign off. Please re-consult as needed with questions or concerns.  Juliet Rude, [facility] 12/09/2020, 7:24 AM Please see Amion for pager number during day hours 7:00am-4:30pm
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
## 2020-12-10 ENCOUNTER — Encounter (HOSPITAL_COMMUNITY): Payer: Self-pay | Admitting: Orthopedic Surgery

## 2020-12-10 MED ORDER — SODIUM CHLORIDE 0.9 % IV SOLN: 1.0000 g | INTRAVENOUS | Status: DC
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
## 2020-12-11 LAB — URINE CULTURE: Culture: 1000 — AB

## 2020-12-11 MED ORDER — METRONIDAZOLE 500 MG PO TABS
500.0000 mg | ORAL_TABLET | Freq: Three times a day (TID) | ORAL | Status: DC
  Administered 2020-12-11: 500 mg via ORAL
  Filled 2020-12-11: qty 1

## 2020-12-11 MED ORDER — OXYCODONE HCL 5 MG PO TABS: 5.0000 mg | ORAL_TABLET | ORAL | 0 refills | Status: DC | PRN

## 2020-12-11 MED ORDER — SULFAMETHOXAZOLE-TRIMETHOPRIM 800-160 MG PO TABS: 1.0000 | ORAL_TABLET | Freq: Two times a day (BID) | ORAL | 0 refills | Status: DC

## 2020-12-11 MED ORDER — MORPHINE SULFATE (PF) 2 MG/ML IV SOLN
2.0000 mg | INTRAVENOUS | Status: DC | PRN
Start: 2020-12-11 — End: 2020-12-12

## 2020-12-11 NOTE — Plan of Care (Signed)
  Problem: Education: Goal: Knowledge of General Education information will improve Description: Including pain rating scale, medication(s)/side effects and non-pharmacologic comfort measures Outcome: Progressing   Problem: Clinical Measurements: Goal: Ability to maintain clinical measurements within normal limits will improve Outcome: Progressing   Problem: Health Behavior/Discharge Planning: Goal: Ability to manage health-related needs will improve Outcome: Progressing   Problem: Activity: Goal: Risk for activity intolerance will decrease Outcome: Progressing   Problem: Pain Managment: Goal: General experience of comfort will improve Outcome: Progressing   Problem: Skin Integrity: Goal: Risk for impaired skin integrity will decrease Outcome: Progressing   Problem: Safety: Goal: Ability to remain free from injury will improve Outcome: Progressing
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
## 2020-12-11 NOTE — Progress Notes (Addendum)
Provided discharge education/instructions to Pt's mother at bedside, all questions and concerns addressed, per CM, wheelchair will be delivered to room prior to discharge, due abx given. Pt to discharge home with belongings accompanied by his mother.  At 1832 Wheelchair delivered to bedside. Pt will discharge to home once abx infusion is finished.

## 2020-12-11 NOTE — Progress Notes (Signed)
PHARMACIST - PHYSICIAN COMMUNICATION DR:   Lajoyce Corners CONCERNING: Antibiotic IV to Oral Route Change Policy  RECOMMENDATION: This patient is receiving metronidazole by the intravenous route.  Based on criteria approved by the Pharmacy and Therapeutics Committee, the antibiotic(s) is/are being converted to the equivalent oral dose form(s).   DESCRIPTION: These criteria include:  The patient is not neutropenic and does not exhibit a GI malabsorption state  The patient is eating (either orally or via tube) and/or has been taking other orally administered medications for a least 24 hours  The patient is improving clinically and has a Tmax < 100.5  If you have questions about this conversion, please contact the Pharmacy Department  []   [phone] )  ( [phone] []   [phone] )  [facility] [x]   [phone] )  [facility] []   [phone] )  [facility] []   [phone] )  [facility]

## 2020-12-11 NOTE — Plan of Care (Signed)
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
## 2020-12-14 ENCOUNTER — Telehealth: Payer: Self-pay | Admitting: Physician Assistant

## 2020-12-14 ENCOUNTER — Ambulatory Visit (INDEPENDENT_AMBULATORY_CARE_PROVIDER_SITE_OTHER): Payer: Self-pay | Admitting: Orthopedic Surgery

## 2020-12-14 ENCOUNTER — Encounter: Payer: Self-pay | Admitting: Physician Assistant

## 2020-12-14 VITALS — Ht 73.0 in | Wt 187.0 lb

## 2020-12-14 DIAGNOSIS — S71131A Puncture wound without foreign body, right thigh, initial encounter: Secondary | ICD-10-CM

## 2020-12-14 LAB — AEROBIC/ANAEROBIC CULTURE W GRAM STAIN (SURGICAL/DEEP WOUND)

## 2020-12-14 LAB — CULTURE, BLOOD (ROUTINE X 2)
Culture: NO GROWTH
Culture: NO GROWTH
Special Requests: ADEQUATE

## 2020-12-14 NOTE — Telephone Encounter (Signed)
Appt scheduled for 10:30 today.
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
## 2020-12-14 NOTE — Telephone Encounter (Signed)
Patient's mother Tilda Burrow called stating that Dr. Lajoyce Corners or PA Chales Abrahams need to see this patient for post op today. Called and I spoke to PA Persons and she stated to ask what time patient could come in. Can you please open a slot for 10:30 am this morning for this patient. Phone number is [phone].

## 2020-12-21 ENCOUNTER — Ambulatory Visit (INDEPENDENT_AMBULATORY_CARE_PROVIDER_SITE_OTHER): Payer: Self-pay | Admitting: Orthopedic Surgery

## 2020-12-21 ENCOUNTER — Encounter: Payer: Self-pay | Admitting: Orthopedic Surgery

## 2020-12-21 VITALS — Ht 73.0 in | Wt 187.0 lb

## 2020-12-21 DIAGNOSIS — S71131A Puncture wound without foreign body, right thigh, initial encounter: Secondary | ICD-10-CM

## 2020-12-22 ENCOUNTER — Other Ambulatory Visit: Payer: Self-pay | Admitting: Orthopedic Surgery

## 2020-12-22 ENCOUNTER — Encounter: Payer: Self-pay | Admitting: Orthopedic Surgery

## 2020-12-22 ENCOUNTER — Telehealth: Payer: Self-pay

## 2020-12-22 MED ORDER — SULFAMETHOXAZOLE-TRIMETHOPRIM 800-160 MG PO TABS: 1.0000 | ORAL_TABLET | Freq: Two times a day (BID) | ORAL | 0 refills | Status: AC

## 2020-12-22 MED ORDER — OXYCODONE HCL 5 MG PO TABS: 5.0000 mg | ORAL_TABLET | ORAL | 0 refills | Status: AC | PRN

## 2020-12-22 NOTE — Telephone Encounter (Signed)
Patients mom called she is requesting rx refill for oxycodone and sulfamethoxazole-trimethoprim  patients mom would like a call back when rx has been sent in  call back:[phone]
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
## 2021-01-04 LAB — FUNGUS CULTURE WITH STAIN

## 2021-01-04 LAB — FUNGAL ORGANISM REFLEX

## 2021-01-04 LAB — FUNGUS CULTURE RESULT

## 2021-01-07 ENCOUNTER — Inpatient Hospital Stay: Payer: Self-pay | Admitting: Physician Assistant

## 2021-01-08 ENCOUNTER — Telehealth: Payer: Self-pay | Admitting: Orthopedic Surgery

## 2021-01-08 NOTE — Telephone Encounter (Signed)
Mailed estimated self pay letter to patient today

## 2021-01-16 ENCOUNTER — Emergency Department (HOSPITAL_COMMUNITY)
Admission: EM | Admit: 2021-01-16 | Discharge: 2021-01-29 | Disposition: E | Payer: Self-pay | Attending: Emergency Medicine | Admitting: Emergency Medicine

## 2021-01-16 DIAGNOSIS — I468 Cardiac arrest due to other underlying condition: Secondary | ICD-10-CM

## 2021-01-16 DIAGNOSIS — W3400XA Accidental discharge from unspecified firearms or gun, initial encounter: Secondary | ICD-10-CM | POA: Insufficient documentation

## 2021-01-16 DIAGNOSIS — S51801A Unspecified open wound of right forearm, initial encounter: Secondary | ICD-10-CM | POA: Insufficient documentation

## 2021-01-16 DIAGNOSIS — S21101A Unspecified open wound of right front wall of thorax without penetration into thoracic cavity, initial encounter: Secondary | ICD-10-CM | POA: Insufficient documentation

## 2021-01-18 ENCOUNTER — Ambulatory Visit: Payer: Self-pay | Admitting: Orthopedic Surgery
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
## 2021-01-29 NOTE — ED Notes (Signed)
Medical Examiner transferred to Dr. Freida Busman per his request.

## 2021-01-29 NOTE — ED Notes (Signed)
Patient time of death occurred at 69. Pronounced per Dr Freida Busman.
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
## 2021-01-29 NOTE — ED Notes (Signed)
[name] mother [phone]

## 2021-01-29 DEATH — deceased

## 2021-02-10 ENCOUNTER — Telehealth: Payer: Self-pay | Admitting: Physician Assistant
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
